# Patient Record
Sex: Female | Born: 1970 | Hispanic: Yes | Marital: Married | Smoking: Never smoker
Health system: Southern US, Community
[De-identification: ages and names within clinical notes are randomized; demographics above are authoritative.]

## PROBLEM LIST (undated history)

## (undated) DIAGNOSIS — F329 Major depressive disorder, single episode, unspecified: Secondary | ICD-10-CM

## (undated) DIAGNOSIS — F32A Depression, unspecified: Secondary | ICD-10-CM

## (undated) HISTORY — DX: Depression, unspecified: F32.A

## (undated) HISTORY — DX: Major depressive disorder, single episode, unspecified: F32.9

---

## 2004-09-25 ENCOUNTER — Ambulatory Visit: Payer: Self-pay

## 2013-02-19 ENCOUNTER — Emergency Department: Payer: Self-pay | Admitting: Emergency Medicine

## 2013-07-27 ENCOUNTER — Emergency Department: Payer: Self-pay | Admitting: Emergency Medicine

## 2014-07-06 ENCOUNTER — Encounter: Payer: Self-pay | Admitting: Emergency Medicine

## 2014-07-06 ENCOUNTER — Emergency Department
Admission: EM | Admit: 2014-07-06 | Discharge: 2014-07-06 | Disposition: A | Discharge status: Home / Self Care | Payer: Self-pay | Attending: Emergency Medicine | Admitting: Emergency Medicine

## 2014-07-06 ENCOUNTER — Emergency Department: Payer: Self-pay

## 2014-07-06 DIAGNOSIS — S39012A Strain of muscle, fascia and tendon of lower back, initial encounter: Secondary | ICD-10-CM | POA: Insufficient documentation

## 2014-07-06 DIAGNOSIS — Y9389 Activity, other specified: Secondary | ICD-10-CM | POA: Insufficient documentation

## 2014-07-06 DIAGNOSIS — Y998 Other external cause status: Secondary | ICD-10-CM | POA: Insufficient documentation

## 2014-07-06 DIAGNOSIS — Y9289 Other specified places as the place of occurrence of the external cause: Secondary | ICD-10-CM | POA: Insufficient documentation

## 2014-07-06 DIAGNOSIS — X58XXXA Exposure to other specified factors, initial encounter: Secondary | ICD-10-CM | POA: Insufficient documentation

## 2014-07-06 MED ORDER — ORPHENADRINE CITRATE 30 MG/ML IJ SOLN
60.0000 mg | Freq: Once | INTRAMUSCULAR | Status: AC
  Administered 2014-07-06: 60 mg via INTRAMUSCULAR

## 2014-07-06 MED ORDER — HYDROMORPHONE HCL 1 MG/ML IJ SOLN
INTRAMUSCULAR | Status: AC
  Administered 2014-07-06: 1 mg via INTRAMUSCULAR
  Filled 2014-07-06: qty 1

## 2014-07-06 MED ORDER — KETOROLAC TROMETHAMINE 60 MG/2ML IM SOLN
60.0000 mg | Freq: Once | INTRAMUSCULAR | Status: AC
Start: 2014-07-06 — End: 2014-07-06
  Administered 2014-07-06: 60 mg via INTRAMUSCULAR

## 2014-07-06 MED ORDER — CYCLOBENZAPRINE HCL 10 MG PO TABS: 10.0000 mg | ORAL_TABLET | Freq: Three times a day (TID) | ORAL | Status: DC | PRN

## 2014-07-06 MED ORDER — HYDROMORPHONE HCL 1 MG/ML IJ SOLN
1.0000 mg | Freq: Once | INTRAMUSCULAR | Status: AC
  Administered 2014-07-06: 1 mg via INTRAMUSCULAR

## 2014-07-06 MED ORDER — TRAMADOL HCL 50 MG PO TABS: 50.0000 mg | ORAL_TABLET | Freq: Four times a day (QID) | ORAL | Status: DC | PRN

## 2014-07-06 MED ORDER — KETOROLAC TROMETHAMINE 60 MG/2ML IM SOLN
INTRAMUSCULAR | Status: AC
  Administered 2014-07-06: 60 mg via INTRAMUSCULAR
  Filled 2014-07-06: qty 2

## 2014-07-06 MED ORDER — ORPHENADRINE CITRATE 30 MG/ML IJ SOLN
INTRAMUSCULAR | Status: AC
  Administered 2014-07-06: 60 mg via INTRAMUSCULAR
  Filled 2014-07-06: qty 2

## 2014-07-06 NOTE — ED Provider Notes (Signed)
[facility] Emergency Department Provider Note  ____________________________________________  Time seen: Approximately 6:31 PM  I have reviewed the triage vital signs and the nursing notes.   HISTORY  Chief Complaint Back Pain    HPI Jeanne Moore is a 44 y.o. female complaint acute low back pain for 2 days. Patient stated was no provocative incident for her pain. Pain started when she was trying to get out.. Stated the patient has continued and worsening since yesterday. Pain is unresolved with any over-the-counter medications. Patient is rating the pain is 8/10. Patient denies any radicular component to this pain. Patient states she has had episodic incident since receiving epidural Maximino Sarin heart daughter 16 years ago. Patient states state that condition will require the use of a crop prophylactic or physical therapy. State the onset of acuity of this pain is different from previous incidents although is located in the same area. Patient denies any bladder or bowel dysfunction.   History reviewed. No pertinent past medical history.  There are no active problems to display for this patient.   History reviewed. No pertinent past surgical history.  No current outpatient prescriptions on file.  Allergies Review of patient's allergies indicates no known allergies.  No family history on file.  Social History History  Substance Use Topics  . Smoking status: Never Smoker   . Smokeless tobacco: Not on file  . Alcohol Use: No    Review of Systems Constitutional: No fever/chills Eyes: No visual changes. ENT: No sore throat. Cardiovascular: Denies chest pain. Respiratory: Denies shortness of breath. Gastrointestinal: No abdominal pain.  No nausea, no vomiting.  No diarrhea.  No constipation. Genitourinary: Negative for dysuria. Musculoskeletal: Positive for acute back pain Skin: Negative for rash. Neurological: Negative for headaches,  focal weakness or numbness. 10-point ROS otherwise negative.  ____________________________________________   PHYSICAL EXAM:  VITAL SIGNS: ED Triage Vitals  Enc Vitals Group     BP 07/06/14 1754 100/70 mmHg     Pulse Rate 07/06/14 1754 74     Resp 07/06/14 1754 18     Temp 07/06/14 1754 98.2 F (36.8 C)     Temp Source 07/06/14 1754 Oral     SpO2 07/06/14 1754 100 %     Weight 07/06/14 1754 140 lb (63.504 kg)     Height 07/06/14 1754  (1.626 m)     Head Cir --      Peak Flow --      Pain Score 07/06/14 1755 8     Pain Loc --      Pain Edu? --      Excl. in GC? --     Constitutional: Alert and oriented. Appears in moderate distress.. Eyes: Conjunctivae are normal. PERRL. EOMI. Head: Atraumatic. Nose: No congestion/rhinnorhea. Mouth/Throat: Mucous membranes are moist.  Oropharynx non-erythematous. Neck: No stridor.  No deformity for nuchal range of motion nontender palpation. Hematological/Lymphatic/Immunilogical: No cervical lymphadenopathy. Cardiovascular: Normal rate, regular rhythm. Grossly normal heart sounds.  Good peripheral circulation. Respiratory: Normal respiratory effort.  No retractions. Lungs CTAB. Gastrointestinal: Soft and nontender. No distention. No abdominal bruits. No CVA tenderness. Musculoskeletal: No lower extremity tenderness nor edema.  No joint effusions. No spinal deformity tender palpation L4-S1. Patient decreased range of motion all fields with intense right paraspinal muscle spasms. Patient demonstrated negative straight leg test. Neurologic:  Normal speech and language. No gross focal neurologic deficits are appreciated. Speech is normal. No gait instability. Skin:  Skin is warm, dry and intact.  No rash noted. Psychiatric: Mood and affect are normal. Speech and behavior are normal.  ____________________________________________   LABS (all labs ordered are listed, but only abnormal results are displayed)  Labs Reviewed - No data to  display ____________________________________________  EKG   ____________________________________________  RADIOLOGY  No acute findings. ____________________________________________   PROCEDURES  Procedure(s) performed: None  Critical Care performed: No  ____________________________________________   INITIAL IMPRESSION / ASSESSMENT AND PLAN / ED COURSE  Pertinent labs & imaging results that were available during my care of the patient were reviewed by me and considered in my medical decision making (see chart for details).  Acute low back pain. ____________________________________________   FINAL CLINICAL IMPRESSION(S) / ED DIAGNOSES  Final diagnoses:  Low back strain, initial encounter      Joni ReiningRonald K Yeiren Whitecotton, PA-C 07/06/14 1925  Richardean Canalavid H Yao, MD 07/06/14 2223

## 2014-07-06 NOTE — ED Notes (Signed)
Pt informed to return if any life threatening symptoms occur.  

## 2014-07-06 NOTE — Discharge Instructions (Signed)
Take medications as directed

## 2014-07-06 NOTE — ED Notes (Signed)
Pt woke up with lower back pain yesterday, no known injury to that area.

## 2014-07-20 ENCOUNTER — Ambulatory Visit (INDEPENDENT_AMBULATORY_CARE_PROVIDER_SITE_OTHER): Payer: Self-pay | Admitting: Family Medicine

## 2014-07-20 ENCOUNTER — Encounter: Payer: Self-pay | Admitting: Family Medicine

## 2014-07-20 VITALS — BP 100/70 | HR 85 | Ht 65.0 in | Wt 120.0 lb

## 2014-07-20 DIAGNOSIS — F329 Major depressive disorder, single episode, unspecified: Secondary | ICD-10-CM

## 2014-07-20 DIAGNOSIS — F32A Depression, unspecified: Secondary | ICD-10-CM | POA: Insufficient documentation

## 2014-07-20 MED ORDER — CITALOPRAM HYDROBROMIDE 20 MG PO TABS: 20.0000 mg | ORAL_TABLET | Freq: Every day | ORAL | Status: AC

## 2014-07-20 NOTE — Progress Notes (Signed)
Date:  07/20/2014   Name:  Jeanne Moore   DOB:  [birth year]   MRN:  [id number]  PCP:  Schuyler Amor, MD    Chief Complaint: Depression and Establish Care   History of Present Illness:  This is a 44 y.o. female with 2 month history of fatigue, insomnia, poor appetite, obsessive thoughts, decreased libido. Having some problems with boyfriend and paying bills. Denies suicidal ideation but says sometimes wishes she wouldn't wake up. No personal or family hx depression. No significant PMH, no current meds.  Review of Systems:  Review of Systems  Constitutional: Positive for appetite change and fatigue.  Respiratory: Negative for shortness of breath.   Cardiovascular: Negative for chest pain and leg swelling.  Gastrointestinal: Negative for abdominal pain.  Genitourinary: Negative for difficulty urinating.  Skin: Negative for rash.  Neurological: Negative for tremors.  Hematological: Negative for adenopathy.  Psychiatric/Behavioral: Positive for sleep disturbance, dysphoric mood and decreased concentration. Negative for suicidal ideas, hallucinations, self-injury and agitation. The patient is not nervous/anxious.     Patient Active Problem List   Diagnosis Date Noted  . Depression 07/20/2014    Prior to Admission medications   Medication Sig Start Date End Date Taking? Authorizing Provider  citalopram (CELEXA) 20 MG tablet Take 1 tablet (20 mg total) by mouth daily. 07/20/14   Schuyler Amor, MD    No Known Allergies  Past Surgical History  Procedure Laterality Date  . Cesarean section      History  Substance Use Topics  . Smoking status: Never Smoker   . Smokeless tobacco: Never Used  . Alcohol Use: No    Family History  Problem Relation Age of Onset  . Heart disease Father     Medication list has been reviewed and updated.  Physical Examination: BP 100/70 mmHg  Pulse 85  Ht 5\' 5"  (1.651 m)  Wt 120 lb (54.432 kg)  BMI 19.97 kg/m2  LMP  07/06/2014  Physical Exam  Constitutional: She is oriented to person, place, and time. She appears well-developed and well-nourished.  HENT:  Head: Normocephalic and atraumatic.  Neck: Neck supple. No thyromegaly present.  Cardiovascular: Normal rate, regular rhythm and normal heart sounds.  Exam reveals no gallop.   No murmur heard. Pulmonary/Chest: Effort normal and breath sounds normal. She has no wheezes. She has no rales.  Abdominal: Soft. She exhibits no distension and no mass. There is no guarding.  Musculoskeletal: She exhibits no edema.  Lymphadenopathy:    She has no cervical adenopathy.  Neurological: She is alert and oriented to person, place, and time. Coordination normal.  Skin: Skin is warm and dry.  Psychiatric: Her behavior is normal. Judgment and thought content normal.  Depressed affect    Assessment and Plan:  1. Depression Initial episode, begin SSRI, call if sxs worsen or med se's - TSH - Comprehensive Metabolic Panel (CMET)   Return in about 4 weeks (around 08/17/2014).  Dionne Ano. Kingsley Spittle MD [facility]  07/20/2014

## 2014-07-26 NOTE — Progress Notes (Signed)
Never showed for blood work. 

## 2014-08-23 ENCOUNTER — Ambulatory Visit: Payer: Self-pay | Admitting: Family Medicine

## 2014-08-30 ENCOUNTER — Ambulatory Visit: Payer: Self-pay | Admitting: Family Medicine

## 2017-05-21 ENCOUNTER — Encounter: Payer: Self-pay | Admitting: Emergency Medicine

## 2017-05-21 ENCOUNTER — Emergency Department
Admission: EM | Admit: 2017-05-21 | Discharge: 2017-05-21 | Disposition: A | Discharge status: Home / Self Care | Payer: Self-pay | Attending: Emergency Medicine | Admitting: Emergency Medicine

## 2017-05-21 DIAGNOSIS — Y929 Unspecified place or not applicable: Secondary | ICD-10-CM | POA: Insufficient documentation

## 2017-05-21 DIAGNOSIS — Y999 Unspecified external cause status: Secondary | ICD-10-CM | POA: Insufficient documentation

## 2017-05-21 DIAGNOSIS — X17XXXA Contact with hot engines, machinery and tools, initial encounter: Secondary | ICD-10-CM | POA: Insufficient documentation

## 2017-05-21 DIAGNOSIS — T23232A Burn of second degree of multiple left fingers (nail), not including thumb, initial encounter: Secondary | ICD-10-CM | POA: Insufficient documentation

## 2017-05-21 DIAGNOSIS — Y939 Activity, unspecified: Secondary | ICD-10-CM | POA: Insufficient documentation

## 2017-05-21 DIAGNOSIS — T23202A Burn of second degree of left hand, unspecified site, initial encounter: Secondary | ICD-10-CM | POA: Insufficient documentation

## 2017-05-21 MED ORDER — SILVER SULFADIAZINE 1 % EX CREA: TOPICAL_CREAM | CUTANEOUS | 1 refills | Status: AC

## 2017-05-21 MED ORDER — SILVER SULFADIAZINE 1 % EX CREA
TOPICAL_CREAM | Freq: Once | CUTANEOUS | Status: AC
  Administered 2017-05-21: 1 via TOPICAL

## 2017-05-21 MED ORDER — CEPHALEXIN 500 MG PO CAPS: 500.0000 mg | ORAL_CAPSULE | Freq: Three times a day (TID) | ORAL | 0 refills | Status: AC

## 2017-05-21 MED ORDER — SULFAMETHOXAZOLE-TRIMETHOPRIM 800-160 MG PO TABS: 1.0000 | ORAL_TABLET | Freq: Two times a day (BID) | ORAL | 0 refills | Status: DC

## 2017-05-21 MED ORDER — SILVER SULFADIAZINE 1 % EX CREA
TOPICAL_CREAM | CUTANEOUS | Status: AC
  Filled 2017-05-21: qty 85

## 2017-05-21 MED ORDER — HYDROCODONE-ACETAMINOPHEN 5-325 MG PO TABS: 1.0000 | ORAL_TABLET | ORAL | 0 refills | Status: AC | PRN
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
## 2018-11-10 ENCOUNTER — Emergency Department: Payer: No Typology Code available for payment source

## 2018-11-10 ENCOUNTER — Encounter: Payer: Self-pay | Admitting: Emergency Medicine

## 2018-11-10 ENCOUNTER — Emergency Department
Admission: EM | Admit: 2018-11-10 | Discharge: 2018-11-10 | Disposition: A | Discharge status: Home / Self Care | Payer: No Typology Code available for payment source | Attending: Emergency Medicine | Admitting: Emergency Medicine

## 2018-11-10 ENCOUNTER — Other Ambulatory Visit: Payer: Self-pay

## 2018-11-10 DIAGNOSIS — S161XXA Strain of muscle, fascia and tendon at neck level, initial encounter: Secondary | ICD-10-CM

## 2018-11-10 DIAGNOSIS — Y939 Activity, unspecified: Secondary | ICD-10-CM | POA: Diagnosis not present

## 2018-11-10 DIAGNOSIS — Y999 Unspecified external cause status: Secondary | ICD-10-CM | POA: Diagnosis not present

## 2018-11-10 DIAGNOSIS — Y92414 Local residential or business street as the place of occurrence of the external cause: Secondary | ICD-10-CM | POA: Insufficient documentation

## 2018-11-10 DIAGNOSIS — S199XXA Unspecified injury of neck, initial encounter: Secondary | ICD-10-CM | POA: Diagnosis present

## 2018-11-10 MED ORDER — BACLOFEN 10 MG PO TABS: 10.0000 mg | ORAL_TABLET | Freq: Three times a day (TID) | ORAL | 0 refills | Status: AC

## 2018-11-10 MED ORDER — MELOXICAM 15 MG PO TABS: 15.0000 mg | ORAL_TABLET | Freq: Every day | ORAL | 2 refills | Status: AC
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
## 2019-05-12 ENCOUNTER — Ambulatory Visit: Payer: Self-pay | Attending: Internal Medicine

## 2019-05-12 DIAGNOSIS — Z20822 Contact with and (suspected) exposure to covid-19: Secondary | ICD-10-CM

## 2019-05-13 LAB — SARS-COV-2, NAA 2 DAY TAT

## 2019-05-13 LAB — NOVEL CORONAVIRUS, NAA: SARS-CoV-2, NAA: NOT DETECTED
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
## 2022-05-27 ENCOUNTER — Ambulatory Visit (LOCAL_COMMUNITY_HEALTH_CENTER): Payer: BLUE CROSS/BLUE SHIELD

## 2022-05-27 ENCOUNTER — Ambulatory Visit: Payer: BLUE CROSS/BLUE SHIELD

## 2022-05-27 DIAGNOSIS — Z23 Encounter for immunization: Secondary | ICD-10-CM | POA: Diagnosis not present

## 2022-05-27 DIAGNOSIS — Z719 Counseling, unspecified: Secondary | ICD-10-CM

## 2022-05-27 NOTE — Progress Notes (Signed)
Pt seen in nurse clinic for Immigration vaccines. Pt requested Tdap, Flu, HepB, MMR and Varicella. Pt states she was unable to obtain any vaccine records from [place] because it was over 30 yrs ago, and she also denied having chicken pox. Eligible from Private vaccine supply, given pt  VIS, agreed for Heplisav-B, Fluzone, Tdap, MMR and Varicella. Administered vaccines, tolerated well. Given [facility] copies, explained and understood next vaccine appointment, M.Sabeen Piechocki, LPN.

## 2022-06-24 ENCOUNTER — Ambulatory Visit (LOCAL_COMMUNITY_HEALTH_CENTER): Payer: BLUE CROSS/BLUE SHIELD

## 2022-06-24 ENCOUNTER — Ambulatory Visit: Payer: BLUE CROSS/BLUE SHIELD

## 2022-06-24 DIAGNOSIS — Z23 Encounter for immunization: Secondary | ICD-10-CM

## 2022-06-24 DIAGNOSIS — Z719 Counseling, unspecified: Secondary | ICD-10-CM

## 2022-06-24 NOTE — Progress Notes (Signed)
Pt returned to nurse clinic for 2nd dose MMR, Varicella, Hep B and Tdap need for Immigration requirements. Pt agreed for 3 vaccines except Tdap, pt states she only need one time shot of Tdap for immigration. Administered Heplisav-B, MMR and Varicella, tolerated well. Given VIS and [facility] copies, explained and understood. M.Salathiel Ferrara, LPN.

## 2023-09-03 ENCOUNTER — Other Ambulatory Visit: Payer: Self-pay

## 2023-09-03 DIAGNOSIS — Z1231 Encounter for screening mammogram for malignant neoplasm of breast: Secondary | ICD-10-CM
